# Patient Record
Sex: Male | Born: 1975 | Race: White | Hispanic: No | Marital: Single | Smoking: Light tobacco smoker
Health system: Southern US, Community
[De-identification: ages and names within clinical notes are randomized; demographics above are authoritative.]

---

## 2009-04-15 ENCOUNTER — Emergency Department (HOSPITAL_COMMUNITY): Admission: EM | Admit: 2009-04-15 | Discharge: 2009-04-15 | Payer: Self-pay | Admitting: Emergency Medicine

## 2011-01-04 LAB — COMPREHENSIVE METABOLIC PANEL
ALT: 15 U/L (ref 0–53)
AST: 20 U/L (ref 0–37)
Albumin: 4.2 g/dL (ref 3.5–5.2)
CO2: 29 mEq/L (ref 19–32)
Calcium: 9.4 mg/dL (ref 8.4–10.5)
Chloride: 102 mEq/L (ref 96–112)
Creatinine, Ser: 0.97 mg/dL (ref 0.4–1.5)
GFR calc Af Amer: >60 mL/min (ref >60)
Sodium: 139 mEq/L (ref 135–145)
Total Bilirubin: 0.6 mg/dL (ref 0.3–1.2)

## 2011-01-04 LAB — CBC
MCV: 90.9 fL (ref 78.0–100.0)
Platelets: 199 10*3/uL (ref 150–400)
RBC: 4.53 MIL/uL (ref 4.22–5.81)
WBC: 7.5 10*3/uL (ref 4.0–10.5)

## 2011-01-04 LAB — DIFFERENTIAL
Eosinophils Absolute: 0.1 10*3/uL (ref 0.0–0.7)
Eosinophils Relative: 1 % (ref 0–5)
Lymphocytes Relative: 30 % (ref 12–46)
Lymphs Abs: 2.3 10*3/uL (ref 0.7–4.0)
Monocytes Absolute: 0.9 10*3/uL (ref 0.1–1.0)

## 2011-02-26 IMAGING — CT CT HEAD W/O CM
1 series · 16 of 30 positions shown, 20 images · non-contrast
Comparison: None

CLINICAL DATA: Dizziness.

CT HEAD WITHOUT CONTRAST
TECHNIQUE: Contiguous axial images were obtained from the base of
the skull through the vertex without contrast.

[Series 2: headseq 4.8 h37s · axial · 0.43mm/px · z∈[+102,+257]mm · 16 of 36 slices shown, 20 images]
[im 2/36  brain]
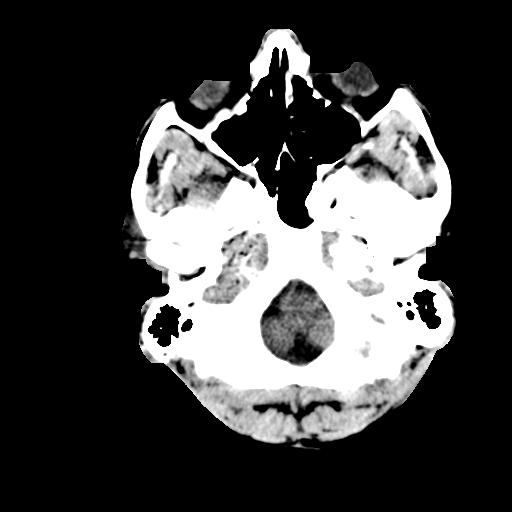
[im 2/36  bone]
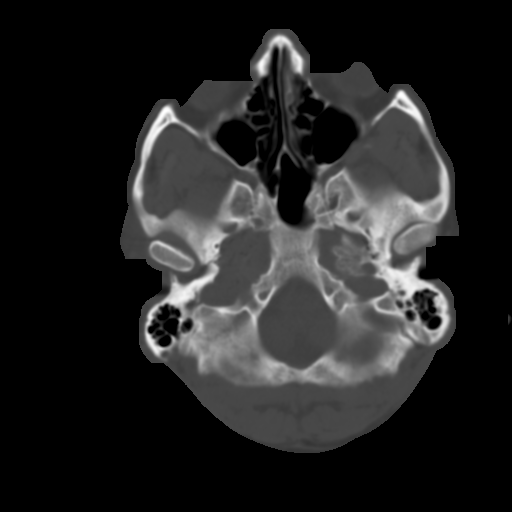
[im 4/36  brain]
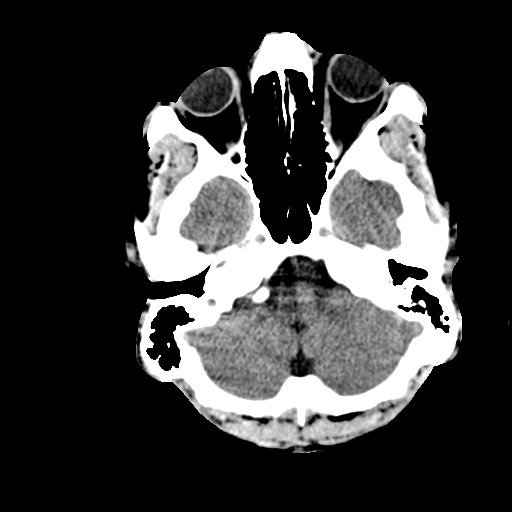
[im 7/36  brain]
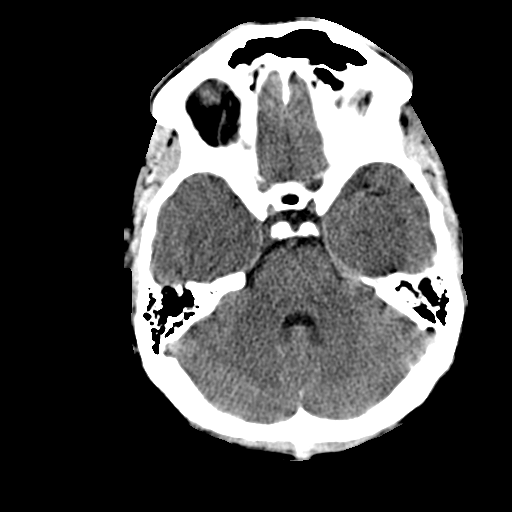
[im 9/36  brain]
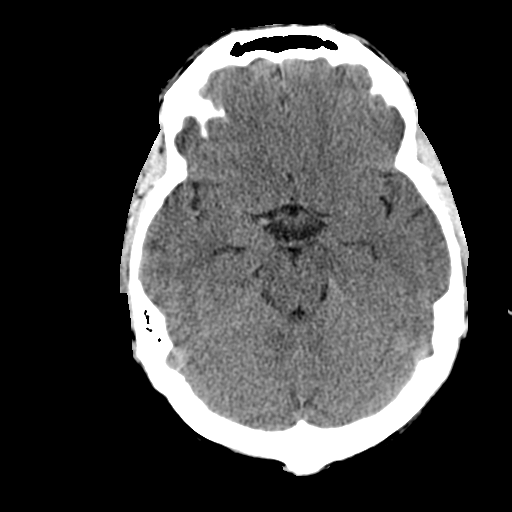
[im 10/36  brain]
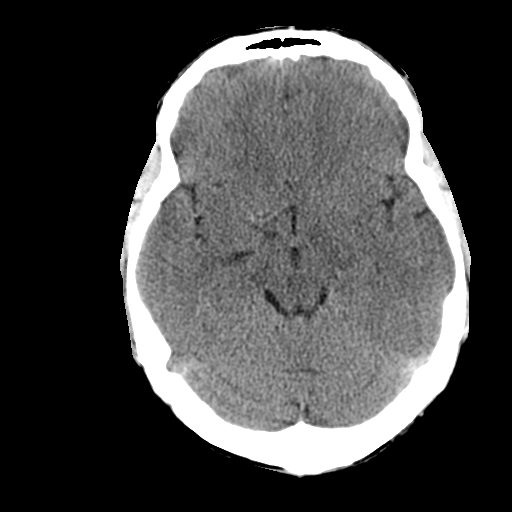
[im 10/36  bone]
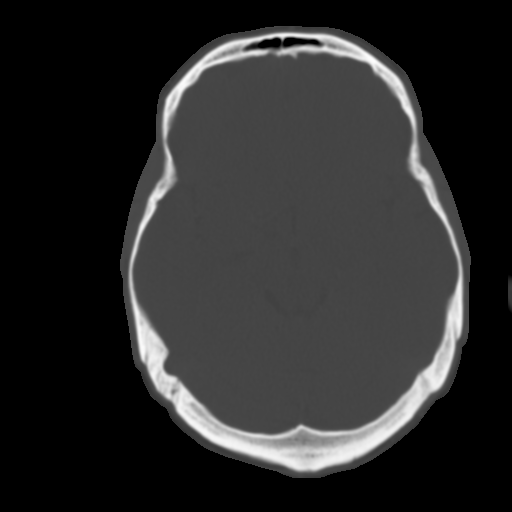
[im 13/36  brain]
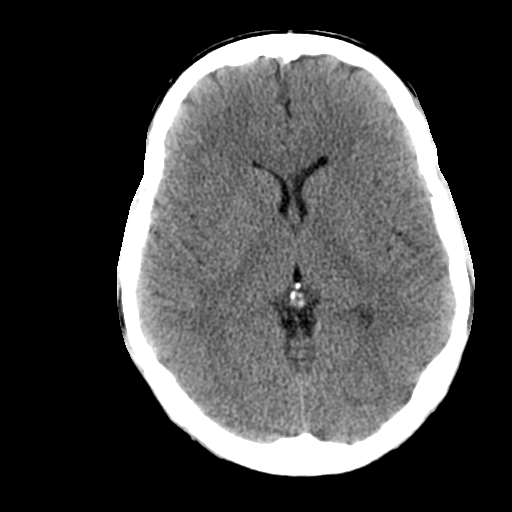
[im 15/36  brain]
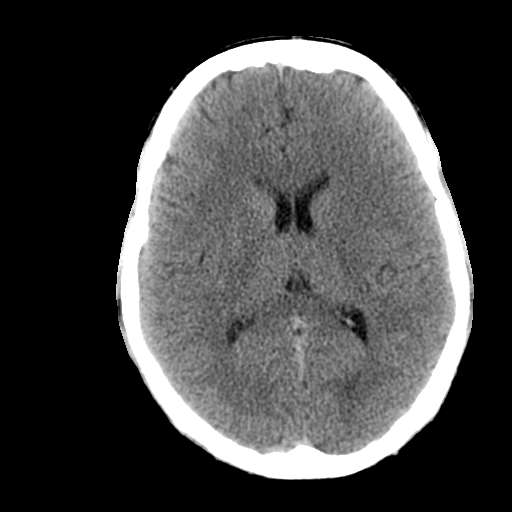
[im 17/36  brain]
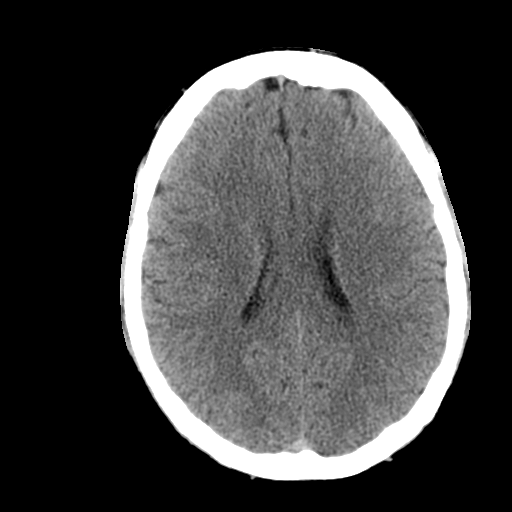
[im 19/36  brain]
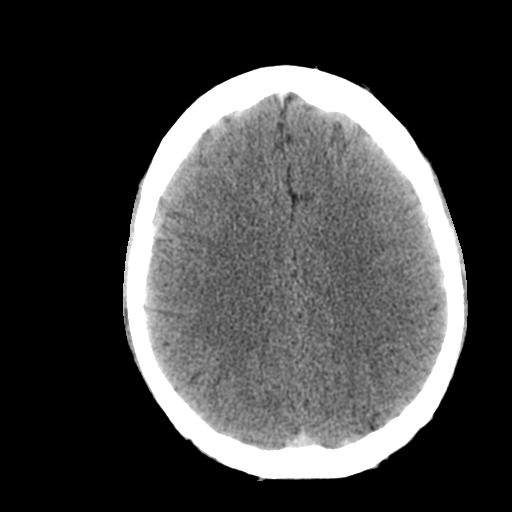
[im 19/36  bone]
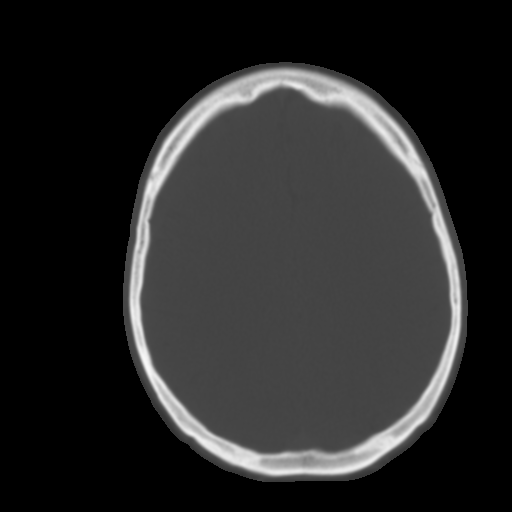
[im 21/36  brain]
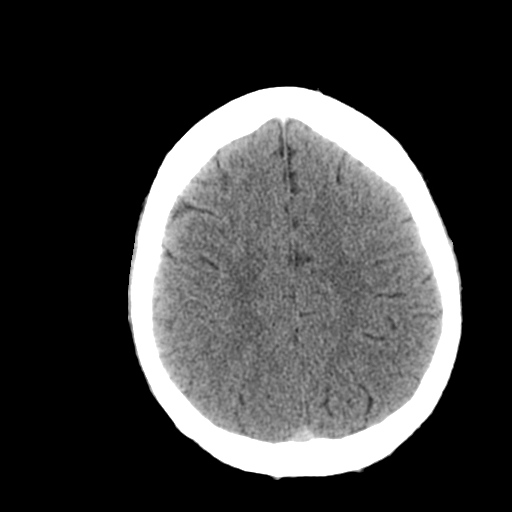
[im 23/36  brain]
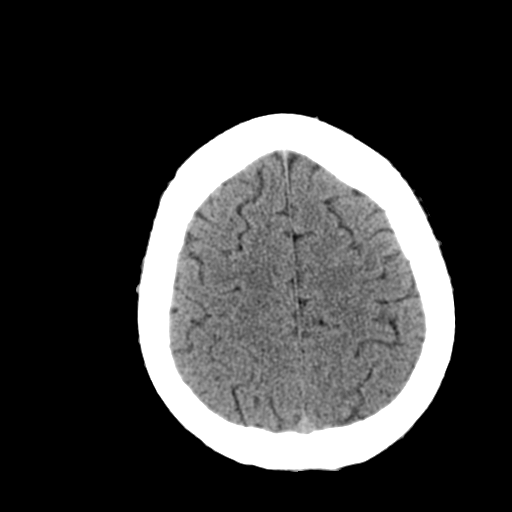
[im 26/36  brain]
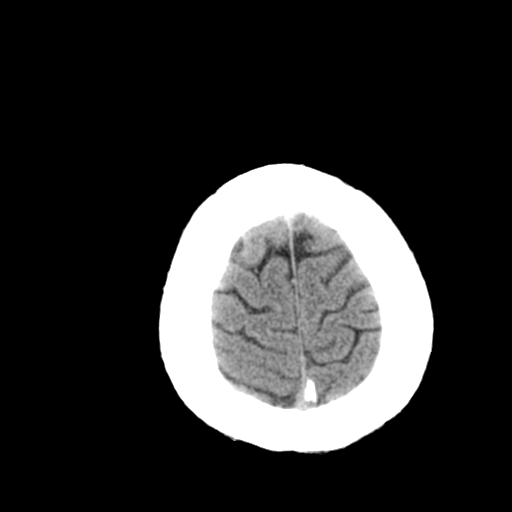
[im 27/36  brain]
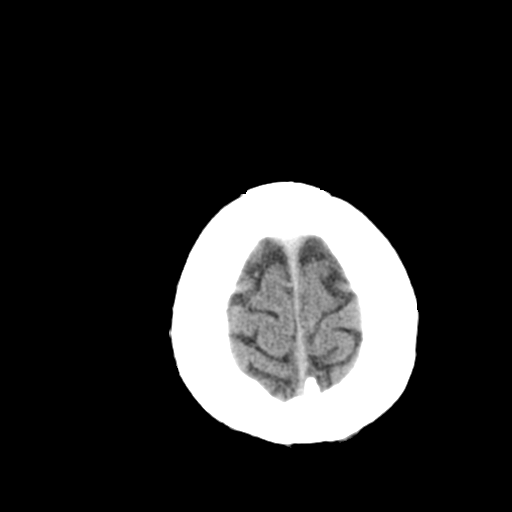
[im 27/36  bone]
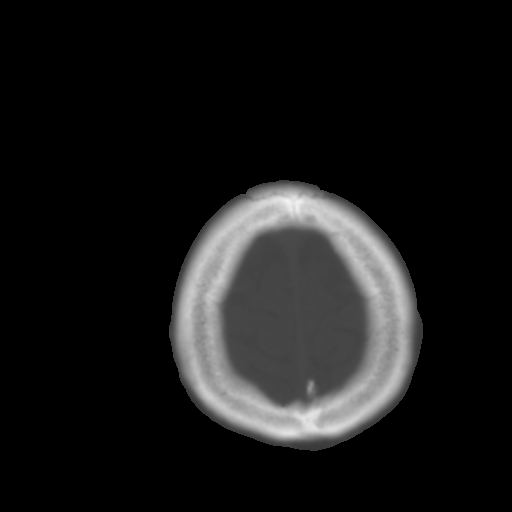
[im 29/36  brain]
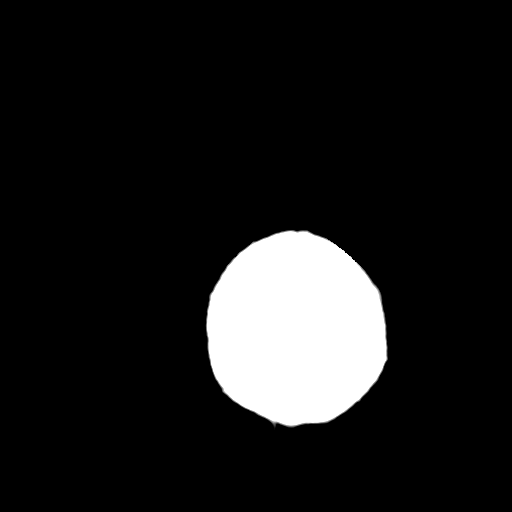
[im 32/36  brain]
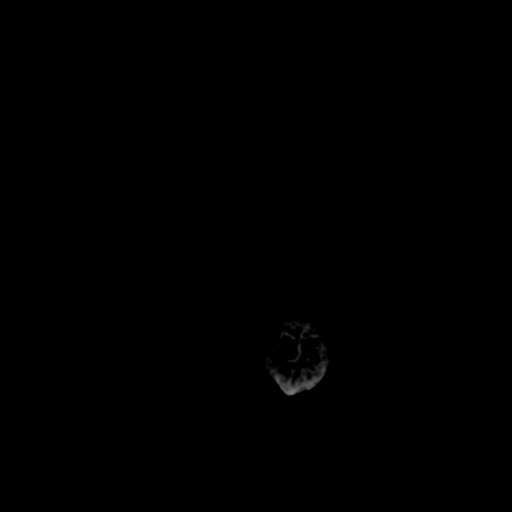
[im 34/36  brain]
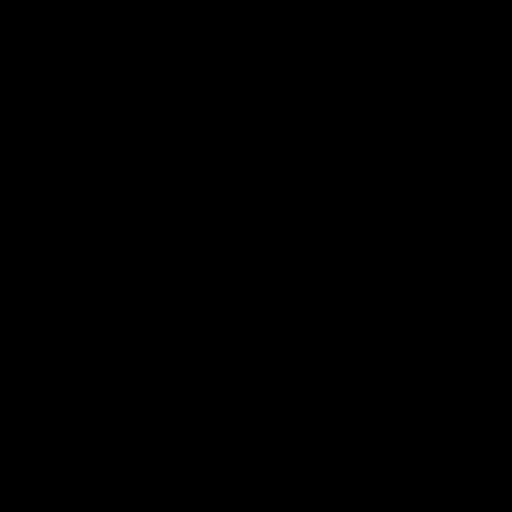

[16 of 30 positions shown; findings below may reference images not displayed]

FINDINGS: Bone windows demonstrate clear paranasal sinuses and
mastoid air cells.

Soft tissue windows demonstrate no  mass lesion, hemorrhage,
hydrocephalus, acute infarct, intra-axial, or extra-axial fluid
collection.
IMPRESSION: No acute intracranial abnormality.

## 2014-12-05 ENCOUNTER — Ambulatory Visit (INDEPENDENT_AMBULATORY_CARE_PROVIDER_SITE_OTHER): Payer: BLUE CROSS/BLUE SHIELD | Admitting: Nurse Practitioner

## 2014-12-05 ENCOUNTER — Encounter: Payer: Self-pay | Admitting: Family Medicine

## 2014-12-05 ENCOUNTER — Encounter: Payer: Self-pay | Admitting: Nurse Practitioner

## 2014-12-05 VITALS — BP 124/88 | Temp 98.5°F | Ht 71.0 in | Wt 223.5 lb

## 2014-12-05 DIAGNOSIS — J111 Influenza due to unidentified influenza virus with other respiratory manifestations: Secondary | ICD-10-CM | POA: Diagnosis not present

## 2014-12-05 MED ORDER — OSELTAMIVIR PHOSPHATE 75 MG PO CAPS: 75.0000 mg | ORAL_CAPSULE | Freq: Two times a day (BID) | ORAL | Status: AC

## 2014-12-08 ENCOUNTER — Encounter: Payer: Self-pay | Admitting: Nurse Practitioner

## 2014-12-08 NOTE — Progress Notes (Signed)
Subjective:  Presents as a new patient for c/o body aches, headache, sore throat, fatigue, fever, cough, runny nose. Possible wheeze. Mild abdominal pain. Taking fluids well. Voiding nl. Smoke 1/3 ppd.  Objective:   BP 124/88 mmHg  Temp(Src) 98.5 F (36.9 C) (Oral)  Ht 5\' 11"  (1.803 m)  Wt 223 lb 8 oz (101.379 kg)  BMI 31.19 kg/m2 NAD. Alert, oriented. TMs clear. Pharynx clear. Neck supple with mild adenopathy. Lungs clear. Heart RRR.   Assessment: Influenza  Plan:  Meds ordered this encounter  Medications  . oseltamivir (TAMIFLU) 75 MG capsule    Sig: Take 1 capsule (75 mg total) by mouth 2 (two) times daily.    Dispense:  10 capsule    Refill:  0    Order Specific Question:  Supervising Provider    Answer:  Merlyn AlbertLUKING, WILLIAM S [[id number]]   OTC meds as directed. Warning signs reviewed. Call back in 4 days if no better, sooner if worse.

## 2019-10-30 ENCOUNTER — Encounter: Payer: Self-pay | Admitting: Family Medicine
